# Patient Record
Sex: Female | Born: 1955 | Race: Black or African American | Hispanic: No | Smoking: Never smoker
Health system: Southern US, Community
[De-identification: ages and names within clinical notes are randomized; demographics above are authoritative.]

## PROBLEM LIST (undated history)

## (undated) DIAGNOSIS — E119 Type 2 diabetes mellitus without complications: Secondary | ICD-10-CM

## (undated) DIAGNOSIS — K219 Gastro-esophageal reflux disease without esophagitis: Secondary | ICD-10-CM

---

## 2019-08-28 DIAGNOSIS — E782 Mixed hyperlipidemia: Secondary | ICD-10-CM | POA: Insufficient documentation

## 2019-08-28 DIAGNOSIS — E118 Type 2 diabetes mellitus with unspecified complications: Secondary | ICD-10-CM | POA: Insufficient documentation

## 2019-08-28 DIAGNOSIS — K219 Gastro-esophageal reflux disease without esophagitis: Secondary | ICD-10-CM | POA: Insufficient documentation

## 2019-08-28 DIAGNOSIS — I10 Essential (primary) hypertension: Secondary | ICD-10-CM | POA: Insufficient documentation

## 2019-08-28 DIAGNOSIS — G894 Chronic pain syndrome: Secondary | ICD-10-CM | POA: Insufficient documentation

## 2019-09-09 DIAGNOSIS — G8929 Other chronic pain: Secondary | ICD-10-CM | POA: Insufficient documentation

## 2019-09-10 ENCOUNTER — Other Ambulatory Visit: Payer: Self-pay | Admitting: Nurse Practitioner

## 2019-09-10 DIAGNOSIS — R1013 Epigastric pain: Secondary | ICD-10-CM

## 2019-09-10 DIAGNOSIS — R1319 Other dysphagia: Secondary | ICD-10-CM

## 2019-09-10 DIAGNOSIS — K219 Gastro-esophageal reflux disease without esophagitis: Secondary | ICD-10-CM

## 2019-09-16 ENCOUNTER — Other Ambulatory Visit: Payer: Self-pay

## 2019-09-16 ENCOUNTER — Ambulatory Visit
Admission: RE | Admit: 2019-09-16 | Discharge: 2019-09-16 | Disposition: A | Discharge status: Home / Self Care | Payer: Medicare HMO | Source: Ambulatory Visit | Attending: Nurse Practitioner | Admitting: Nurse Practitioner

## 2019-09-16 DIAGNOSIS — K219 Gastro-esophageal reflux disease without esophagitis: Secondary | ICD-10-CM

## 2019-09-16 DIAGNOSIS — R1013 Epigastric pain: Secondary | ICD-10-CM | POA: Diagnosis present

## 2019-09-16 DIAGNOSIS — R1319 Other dysphagia: Secondary | ICD-10-CM | POA: Diagnosis present

## 2019-09-18 ENCOUNTER — Other Ambulatory Visit: Payer: Self-pay | Admitting: Otolaryngology

## 2019-09-18 DIAGNOSIS — R221 Localized swelling, mass and lump, neck: Secondary | ICD-10-CM

## 2019-09-30 ENCOUNTER — Other Ambulatory Visit: Payer: Self-pay

## 2019-09-30 ENCOUNTER — Ambulatory Visit
Admission: RE | Admit: 2019-09-30 | Discharge: 2019-09-30 | Disposition: A | Discharge status: Home / Self Care | Payer: Medicare HMO | Source: Ambulatory Visit | Attending: Otolaryngology | Admitting: Otolaryngology

## 2019-09-30 DIAGNOSIS — R221 Localized swelling, mass and lump, neck: Secondary | ICD-10-CM | POA: Diagnosis not present

## 2019-09-30 IMAGING — US US SOFT TISSUE HEAD/NECK
1 series · 14 of 19 positions shown · non-contrast
Comparison: None.

CLINICAL DATA: 63-year-old female with a history of neck mass

EXAM:
ULTRASOUND OF HEAD/NECK SOFT TISSUES
TECHNIQUE: Ultrasound examination of the head and neck soft tissues was
performed in the area of clinical concern.

[Series 1: us soft tissue head/neck · 0.06mm/px · 19 acquisitions, 14 frames shown]
[im 1/19]
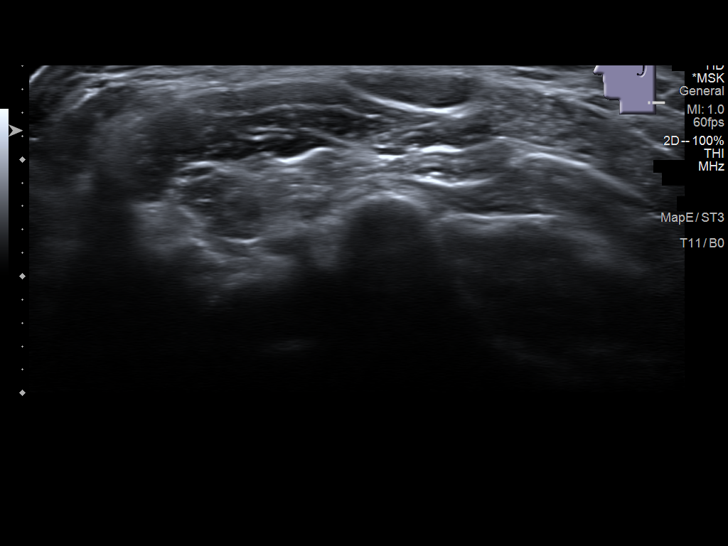
[im 3/19]
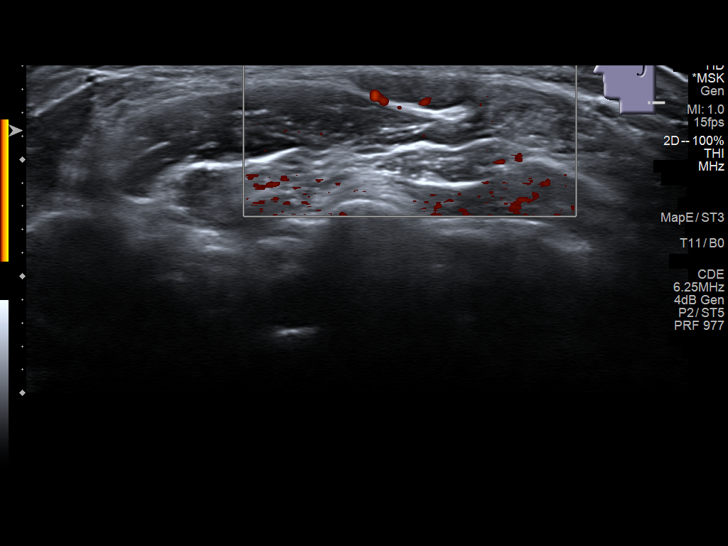
[im 4/19]
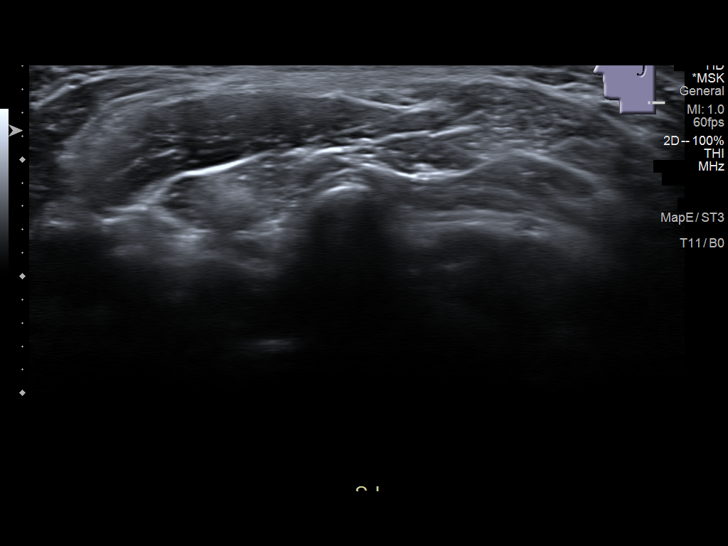
[im 5/19]
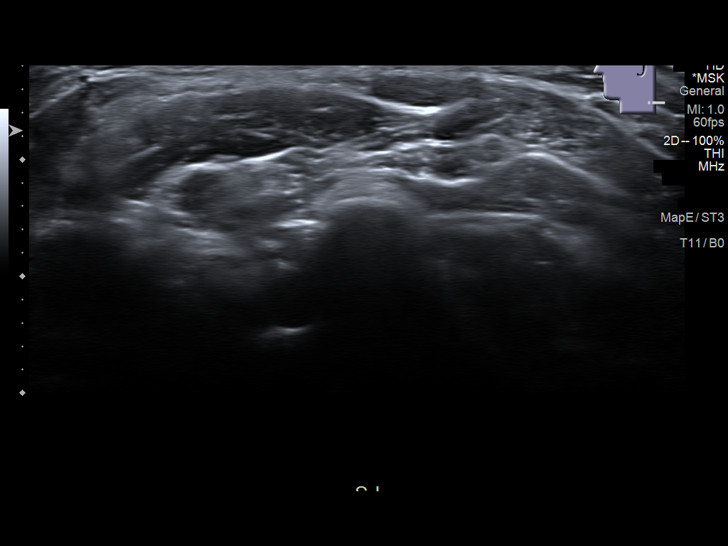
[im 7/19]
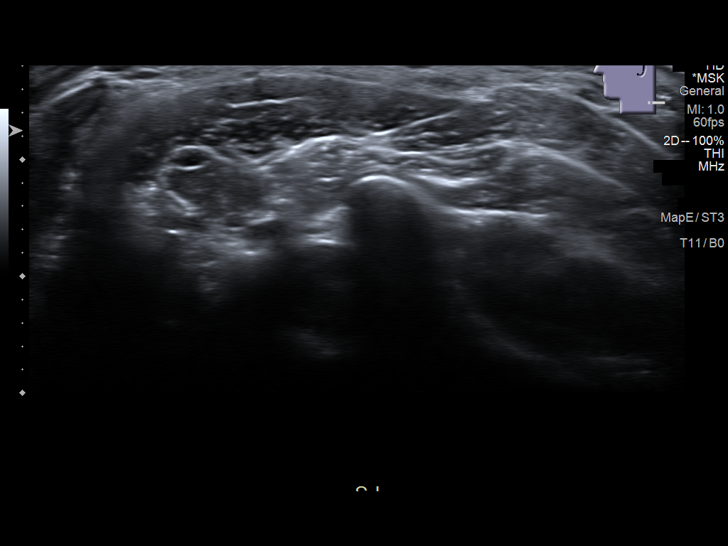
[im 8/19]
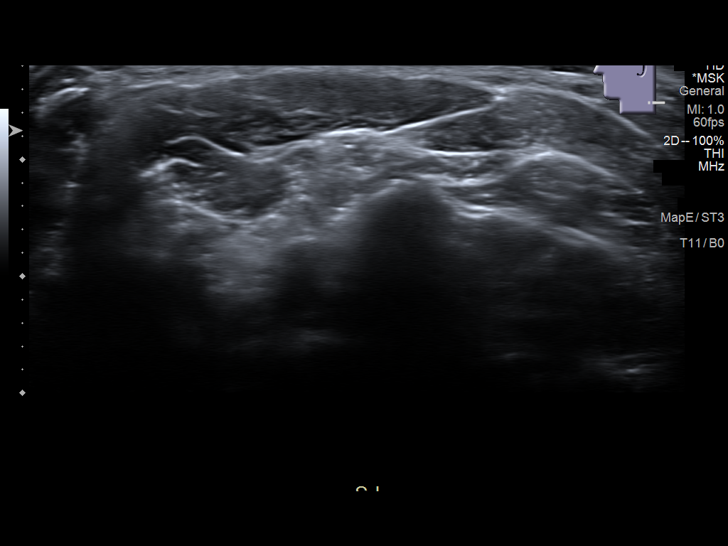
[im 9/19]
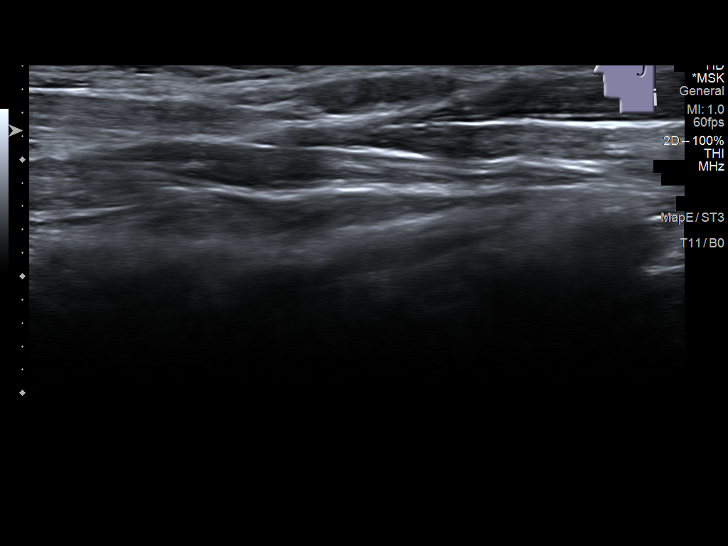
[im 11/19]
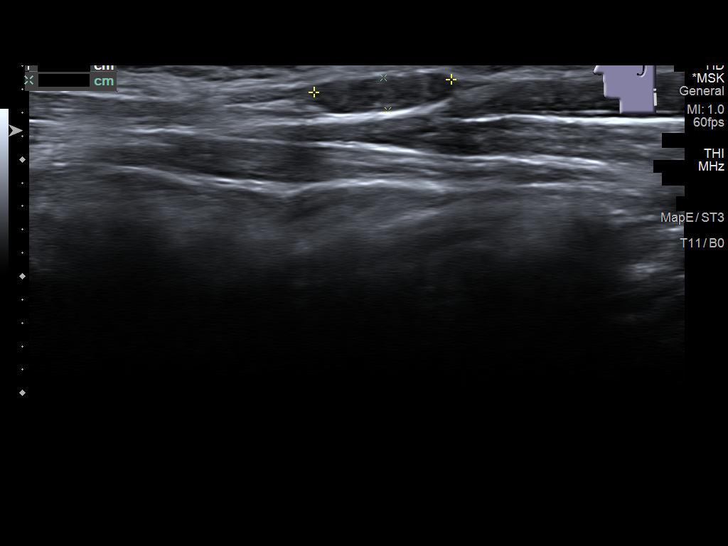
[im 12/19]
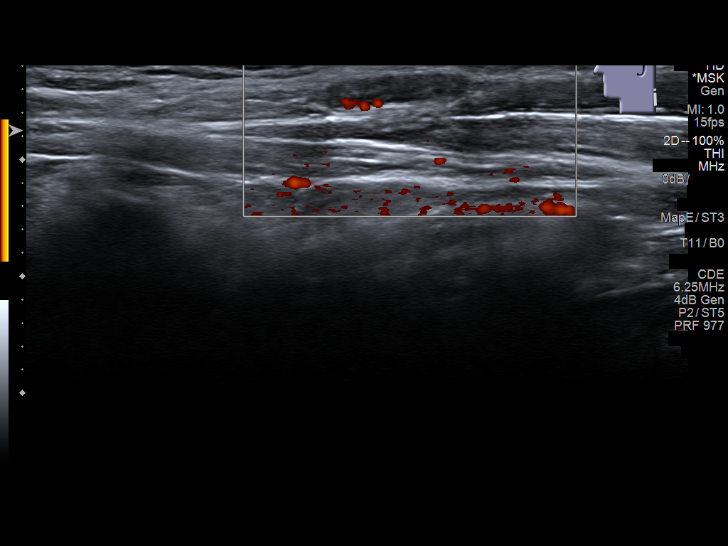
[im 13/19]
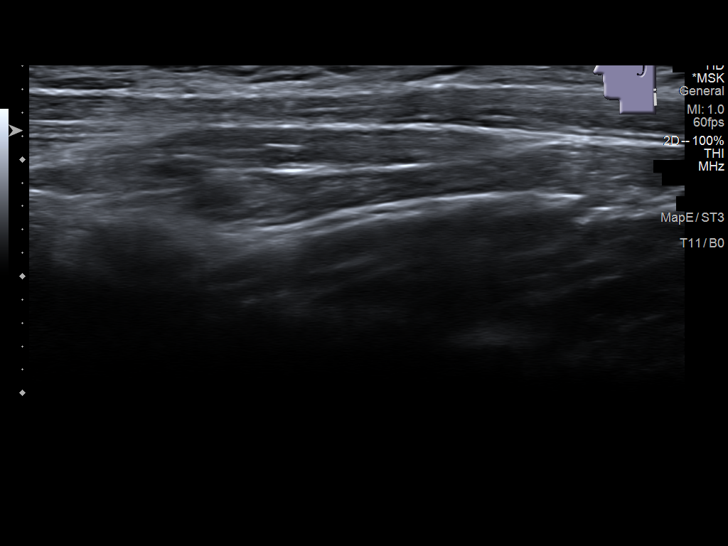
[im 15/19]
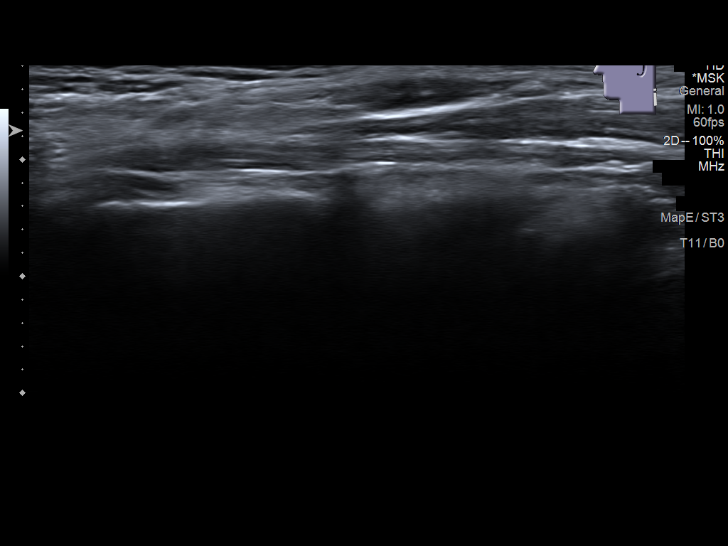
[im 16/19]
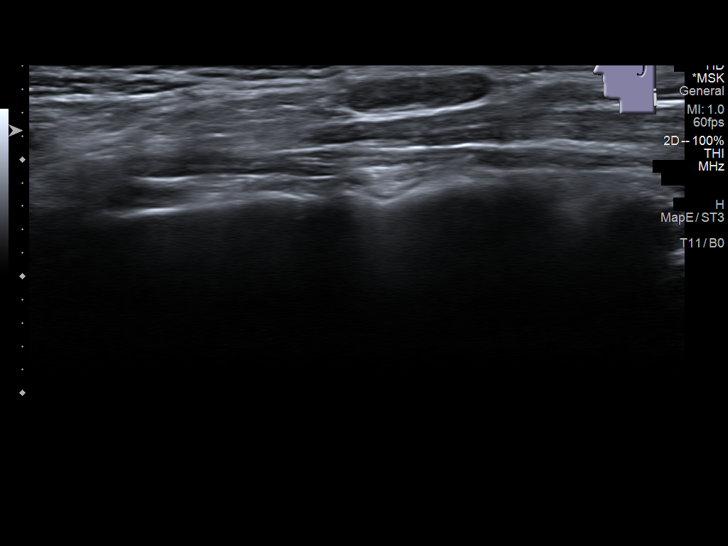
[im 17/19]
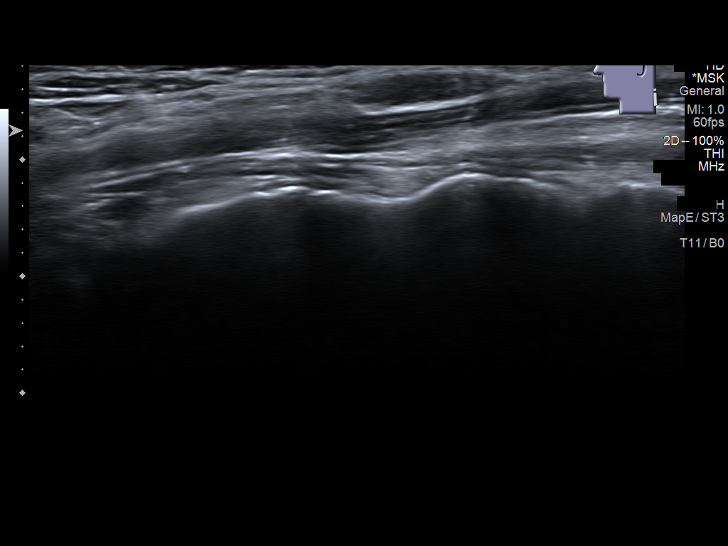
[im 19/19]
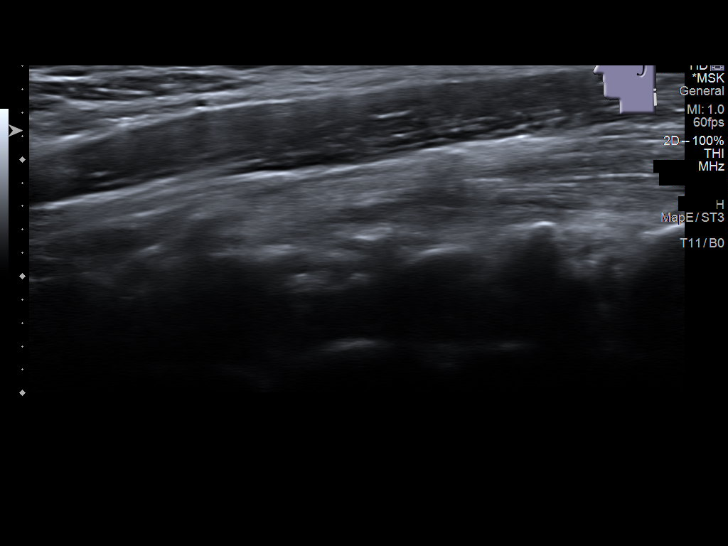

[14 of 19 positions shown; findings below may reference images not displayed]

FINDINGS: Grayscale and color duplex performed in the region of clinical
concern.

There is a uniformly hypoechoic soft tissue lesion isoechoic to the
adjacent musculature within the superficial soft tissues measuring
as long as 1.2 cm with a short axis dimension of 3 mm. Smooth border
with through transmission.
IMPRESSION: Directed duplex demonstrates nonspecific ovoid soft tissue lesion
which most likely represents a lymph node or lipoma

## 2019-12-11 ENCOUNTER — Other Ambulatory Visit: Payer: Self-pay | Admitting: Internal Medicine

## 2019-12-11 DIAGNOSIS — Z1231 Encounter for screening mammogram for malignant neoplasm of breast: Secondary | ICD-10-CM

## 2020-03-26 ENCOUNTER — Ambulatory Visit: Payer: Medicare HMO | Admitting: Urology

## 2020-04-07 ENCOUNTER — Ambulatory Visit: Payer: Medicare HMO | Admitting: Urology

## 2020-09-29 ENCOUNTER — Other Ambulatory Visit: Payer: Self-pay

## 2020-09-29 ENCOUNTER — Ambulatory Visit
Admission: RE | Admit: 2020-09-29 | Discharge: 2020-09-29 | Disposition: A | Discharge status: Home / Self Care | Payer: Medicare HMO | Source: Ambulatory Visit | Attending: Internal Medicine | Admitting: Internal Medicine

## 2020-09-29 DIAGNOSIS — Z1231 Encounter for screening mammogram for malignant neoplasm of breast: Secondary | ICD-10-CM | POA: Insufficient documentation

## 2020-10-17 ENCOUNTER — Emergency Department
Admission: EM | Admit: 2020-10-17 | Discharge: 2020-10-18 | Disposition: A | Discharge status: Home / Self Care | Payer: Medicare HMO | Attending: Emergency Medicine | Admitting: Emergency Medicine

## 2020-10-17 ENCOUNTER — Other Ambulatory Visit: Payer: Self-pay

## 2020-10-17 ENCOUNTER — Emergency Department: Payer: Medicare HMO

## 2020-10-17 ENCOUNTER — Encounter: Payer: Self-pay | Admitting: Emergency Medicine

## 2020-10-17 DIAGNOSIS — E782 Mixed hyperlipidemia: Secondary | ICD-10-CM | POA: Insufficient documentation

## 2020-10-17 DIAGNOSIS — I10 Essential (primary) hypertension: Secondary | ICD-10-CM | POA: Diagnosis not present

## 2020-10-17 DIAGNOSIS — K219 Gastro-esophageal reflux disease without esophagitis: Secondary | ICD-10-CM | POA: Insufficient documentation

## 2020-10-17 DIAGNOSIS — K5732 Diverticulitis of large intestine without perforation or abscess without bleeding: Secondary | ICD-10-CM | POA: Diagnosis not present

## 2020-10-17 DIAGNOSIS — R1012 Left upper quadrant pain: Secondary | ICD-10-CM | POA: Diagnosis present

## 2020-10-17 DIAGNOSIS — E1169 Type 2 diabetes mellitus with other specified complication: Secondary | ICD-10-CM | POA: Diagnosis not present

## 2020-10-17 DIAGNOSIS — K5792 Diverticulitis of intestine, part unspecified, without perforation or abscess without bleeding: Secondary | ICD-10-CM

## 2020-10-17 HISTORY — DX: Type 2 diabetes mellitus without complications: E11.9

## 2020-10-17 HISTORY — DX: Gastro-esophageal reflux disease without esophagitis: K21.9

## 2020-10-17 LAB — URINALYSIS, COMPLETE (UACMP) WITH MICROSCOPIC
Bacteria, UA: NONE SEEN
Bilirubin Urine: NEGATIVE
Glucose, UA: NEGATIVE mg/dL
Hgb urine dipstick: NEGATIVE
Ketones, ur: 5 mg/dL — AB
Leukocytes,Ua: NEGATIVE
Nitrite: NEGATIVE
Protein, ur: NEGATIVE mg/dL
Specific Gravity, Urine: 1.038 — ABNORMAL HIGH (ref 1.005–1.030)
Squamous Epithelial / HPF: NONE SEEN (ref 0–5)
pH: 6 (ref 5.0–8.0)

## 2020-10-17 LAB — TROPONIN I (HIGH SENSITIVITY)
Troponin I (High Sensitivity): 4 ng/L (ref <18)
Troponin I (High Sensitivity): 4 ng/L (ref <18)

## 2020-10-17 LAB — COMPREHENSIVE METABOLIC PANEL
ALT: 13 U/L (ref 0–44)
AST: 20 U/L (ref 15–41)
Albumin: 4.4 g/dL (ref 3.5–5.0)
Alkaline Phosphatase: 77 U/L (ref 38–126)
Anion gap: 12 (ref 5–15)
BUN: 17 mg/dL (ref 8–23)
CO2: 26 mmol/L (ref 22–32)
Calcium: 9.3 mg/dL (ref 8.9–10.3)
Chloride: 100 mmol/L (ref 98–111)
Creatinine, Ser: 0.95 mg/dL (ref 0.44–1.00)
GFR, Estimated: >60 mL/min (ref >60)
Glucose, Bld: 206 mg/dL — ABNORMAL HIGH (ref 70–99)
Potassium: 3.7 mmol/L (ref 3.5–5.1)
Sodium: 138 mmol/L (ref 135–145)
Total Bilirubin: 0.7 mg/dL (ref 0.3–1.2)
Total Protein: 8 g/dL (ref 6.5–8.1)

## 2020-10-17 LAB — CBC
HCT: 39.3 % (ref 36.0–46.0)
Hemoglobin: 13.2 g/dL (ref 12.0–15.0)
MCH: 28.3 pg (ref 26.0–34.0)
MCHC: 33.6 g/dL (ref 30.0–36.0)
MCV: 84.3 fL (ref 80.0–100.0)
Platelets: 256 10*3/uL (ref 150–400)
RBC: 4.66 MIL/uL (ref 3.87–5.11)
RDW: 13.3 % (ref 11.5–15.5)
WBC: 11.6 10*3/uL — ABNORMAL HIGH (ref 4.0–10.5)
nRBC: 0 % (ref 0.0–0.2)

## 2020-10-17 LAB — LIPASE, BLOOD: Lipase: 29 U/L (ref 11–51)

## 2020-10-17 MED ORDER — ONDANSETRON 4 MG PO TBDP: 4.0000 mg | ORAL_TABLET | Freq: Once | ORAL | Status: DC | PRN

## 2020-10-17 MED ORDER — ALUM & MAG HYDROXIDE-SIMETH 200-200-20 MG/5ML PO SUSP
30.0000 mL | Freq: Once | ORAL | Status: AC
  Administered 2020-10-17: 30 mL via ORAL
  Filled 2020-10-17: qty 30

## 2020-10-17 MED ORDER — IOHEXOL 300 MG/ML  SOLN
100.0000 mL | Freq: Once | INTRAMUSCULAR | Status: AC | PRN
  Administered 2020-10-17: 100 mL via INTRAVENOUS

## 2020-10-17 MED ORDER — DICYCLOMINE HCL 10 MG/5ML PO SOLN
10.0000 mg | Freq: Once | ORAL | Status: AC
  Administered 2020-10-17: 10 mg via ORAL
  Filled 2020-10-17: qty 5

## 2020-10-17 MED ORDER — LIDOCAINE VISCOUS HCL 2 % MT SOLN
15.0000 mL | Freq: Once | OROMUCOSAL | Status: AC
  Administered 2020-10-17: 15 mL via ORAL
  Filled 2020-10-17: qty 15

## 2020-10-17 NOTE — ED Notes (Signed)
Pt called out with call bell stating she was ready to give urine sample. Pt disconnected from monitor and ambulatory independently to toilet. Clean catch urine sample collected and sent to lab.

## 2020-10-17 NOTE — ED Notes (Signed)
Patient transported to CT 

## 2020-10-17 NOTE — ED Triage Notes (Signed)
Pt arrived via POV with reports of upper abd pain since 3pm today with vomiting. Pt also reports back pain with the abd pain.  Denies any diarrhea.

## 2020-10-17 NOTE — ED Provider Notes (Addendum)
[facility] Emergency Department Provider Note   ____________________________________________   First MD Initiated Contact with Patient 10/17/20 2104     (approximate)  I have reviewed the triage vital signs and the nursing notes.   HISTORY  Chief Complaint Abdominal Pain   HPI Leslie Nixon is a 64 y.o. female who reports left upper abdominal pain starting this afternoon.  She has had some vomiting with it.  Also a little bit of back pain that she complained back to the nurse.  No diarrhea or constipation.  No blood in the vomit.  She is not had this before.  She reports the pain is sharp.  Moderate in severity.  Nothing seems to make it better or worse although it is worse with palpation.         Past Medical History:  Diagnosis Date  . Diabetes mellitus without complication (HCC)   . GERD (gastroesophageal reflux disease)     Patient Active Problem List   Diagnosis Date Noted  . Chronic epigastric pain 09/09/2019  . Chronic pain syndrome 08/28/2019  . HTN, goal below 140/80 08/28/2019  . Mixed hyperlipidemia 08/28/2019  . Gastroesophageal reflux disease without esophagitis 08/28/2019  . Type II diabetes mellitus with complication (HCC) 08/28/2019    History reviewed. No pertinent surgical history.  Prior to Admission medications   Not on File    Allergies Lisinopril  Family History  Problem Relation Age of Onset  . Breast cancer Neg Hx     Social History Social History   Tobacco Use  . Smoking status: Never Smoker  . Smokeless tobacco: Never Used  Vaping Use  . Vaping Use: Never used  Substance Use Topics  . Alcohol use: Not Currently  . Drug use: Not on file    Review of Systems  Constitutional: No fever/chills Eyes: No visual changes. ENT: No sore throat. Cardiovascular: Denies chest pain. Respiratory: Denies shortness of breath. Gastrointestinal: abdominal pain.  Nausea,  vomiting.  No diarrhea.  No  constipation. Genitourinary: Negative for dysuria. Musculoskeletal: Negative for back pain. Skin: Negative for rash. Neurological: Negative for headaches, focal weakness  ____________________________________________   PHYSICAL EXAM:  VITAL SIGNS: ED Triage Vitals  Enc Vitals Group     BP 10/17/20 1953 (!) 152/78     Pulse Rate 10/17/20 1953 79     Resp 10/17/20 1953 18     Temp 10/17/20 1953 99.4 F (37.4 C)     Temp Source 10/17/20 1953 Oral     SpO2 10/17/20 1953 98 %     Weight 10/17/20 1949 140 lb (63.5 kg)     Height 10/17/20 1949 5\' 1"  (1.549 m)     Head Circumference --      Peak Flow --      Pain Score 10/17/20 1948 8     Pain Loc --      Pain Edu? --      Excl. in GC? --     Constitutional: Alert and oriented. Well appearing and in no acute distress. Eyes: Conjunctivae are normal. PER EOMI. Head: Atraumatic. Nose: No congestion/rhinnorhea. Mouth/Throat: Mucous membranes are moist.  Oropharynx non-erythematous. Neck: No stridor.  Cardiovascular: Normal rate, regular rhythm. Grossly normal heart sounds.  Good peripheral circulation. Respiratory: Normal respiratory effort.  No retractions. Lungs CTAB. Gastrointestinal: Soft to palpation left upper quadrant no distention. No abdominal bruits.  Musculoskeletal: No lower extremity tenderness nor edema.  Neurologic:  Normal speech and language. No gross focal neurologic deficits  are appreciated.  Skin:  Skin is warm, dry and intact. No rash noted.   ____________________________________________   LABS (all labs ordered are listed, but only abnormal results are displayed)  Labs Reviewed  COMPREHENSIVE METABOLIC PANEL - Abnormal; Notable for the following components:      Result Value   Glucose, Bld 206 (*)    All other components within normal limits  CBC - Abnormal; Notable for the following components:   WBC 11.6 (*)    All other components within normal limits  URINALYSIS, COMPLETE (UACMP) WITH MICROSCOPIC  - Abnormal; Notable for the following components:   Color, Urine YELLOW (*)    APPearance CLEAR (*)    Specific Gravity, Urine 1.038 (*)    Ketones, ur 5 (*)    All other components within normal limits  LIPASE, BLOOD  TROPONIN I (HIGH SENSITIVITY)  TROPONIN I (HIGH SENSITIVITY)   ____________________________________________  EKG EKG read interpreted by me shows normal sinus rhythm rate of 61 left axis decreased R wave progression but no acute ST-T wave changes computer is also reading left anterior hemiblock.  ____________________________________________  RADIOLOGY Jill Poling, personally viewed and evaluated these images (plain radiographs) as part of my medical decision making, as well as reviewing the written report by the radiologist.  ED MD interpretation: CT read by radiology reviewed by me 6 is suggestive of a possible sigmoid diverticulitis with possible narrowing of the sigmoid and one area and some stomach wall thickening which possibly could be due to possible underdistention  Official radiology report(s): CT ABDOMEN PELVIS W CONTRAST  Result Date: 10/17/2020 CLINICAL DATA:  Upper abdominal pain EXAM: CT ABDOMEN AND PELVIS WITH CONTRAST TECHNIQUE: Multidetector CT imaging of the abdomen and pelvis was performed using the standard protocol following bolus administration of intravenous contrast. CONTRAST:  OMNIPAQUE IOHEXOL 300 MG/ML  SOLN COMPARISON:  None. FINDINGS: Lower chest: The lung bases are clear. The heart size is normal. Hepatobiliary: The liver is normal. Status post cholecystectomy.There is no biliary ductal dilation. Pancreas: Normal contours without ductal dilatation. No peripancreatic fluid collection. Spleen: Unremarkable. Adrenals/Urinary Tract: --Adrenal glands: Unremarkable. --Right kidney/ureter: No hydronephrosis or radiopaque kidney stones. --Left kidney/ureter: No hydronephrosis or radiopaque kidney stones. --Urinary bladder: Unremarkable.  Stomach/Bowel: --Stomach/Duodenum: There is suggestion of mild wall thickening at the level of the gastric antrum. The proximal duodenum is unremarkable. --Small bowel: Unremarkable. --Colon: There is sigmoid diverticulosis. There is questionable mild fat stranding about the sigmoid colon (axial series 2, image 58). At this level, there is some luminal narrowing of the sigmoid colon. --Appendix: Normal. Vascular/Lymphatic: Normal course and caliber of the major abdominal vessels. --No retroperitoneal lymphadenopathy. --No mesenteric lymphadenopathy. --No pelvic or inguinal lymphadenopathy. Reproductive: Status post hysterectomy. No adnexal mass. Other: No ascites or free air. The abdominal wall is normal. Musculoskeletal. There is a bilateral pars defect at L5 resulting in grade 1 anterolisthesis of L5 on S1. IMPRESSION: 1. Sigmoid diverticulosis with questionable mild fat stranding about the sigmoid colon. At this level, there is some luminal narrowing of the sigmoid colon. Findings may be secondary to low-grade diverticulitis. Correlation with patient symptomatology is recommended. Given the underlying luminal narrowing, outpatient colonoscopy is recommended for further evaluation. 2. Suggestion of mild wall thickening at the level of the gastric antrum. This could be secondary to underdistention, but gastritis is not excluded. 3. 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 Electronically Signed   By: Beryle Quant.D.  On: 10/17/2020 22:44    ____________________________________________   PROCEDURES  Procedure(s) performed (including Critical Care):  Procedures   ____________________________________________   INITIAL IMPRESSION / ASSESSMENT AND PLAN / ED COURSE  Patient with left upper quadrant pain which is not severe. Anticipate we will treat her for her possible sigmoid diverticulitis and have her follow-up.              ____________________________________________   FINAL CLINICAL IMPRESSION(S) / ED DIAGNOSES  Final diagnoses:  Left upper quadrant abdominal pain     ED Discharge Orders    None      *Please note:  Paris Hohn was evaluated in Emergency Department on 10/17/2020 for the symptoms described in the history of present illness. She was evaluated in the context of the global COVID-19 pandemic, which necessitated consideration that the patient might be at risk for infection with the SARS-CoV-2 virus that causes COVID-19. Institutional protocols and algorithms that pertain to the evaluation of patients at risk for COVID-19 are in a state of rapid change based on information released by regulatory bodies including the CDC and federal and state organizations. These policies and algorithms were followed during the patient's care in the ED.  Some ED evaluations and interventions may be delayed as a result of limited staffing during and the pandemic.*   Note:  This document was prepared using Dragon voice recognition software and may include unintentional dictation errors.    Arnaldo Natal, MD 10/17/20 2340 ----------------------------------------- 12:16 AM on 10/18/2020 -----------------------------------------  Discussed patient with Dr. Tobi Bastos he will follow up.  We will treat her with Cipro and Flagyl.   Arnaldo Natal, MD 10/18/20 [phone]

## 2020-10-18 MED ORDER — ONDANSETRON HCL 4 MG/2ML IJ SOLN
4.0000 mg | Freq: Once | INTRAMUSCULAR | Status: AC
  Administered 2020-10-18: 4 mg via INTRAVENOUS
  Filled 2020-10-18: qty 2

## 2020-10-18 MED ORDER — METRONIDAZOLE 500 MG PO TABS: 500.0000 mg | ORAL_TABLET | Freq: Three times a day (TID) | ORAL | 0 refills | Status: AC

## 2020-10-18 MED ORDER — CIPROFLOXACIN HCL 500 MG PO TABS
500.0000 mg | ORAL_TABLET | Freq: Once | ORAL | Status: AC
  Administered 2020-10-18: 500 mg via ORAL
  Filled 2020-10-18: qty 1

## 2020-10-18 MED ORDER — METRONIDAZOLE 500 MG PO TABS
500.0000 mg | ORAL_TABLET | Freq: Once | ORAL | Status: AC
  Administered 2020-10-18: 500 mg via ORAL
  Filled 2020-10-18: qty 1

## 2020-10-18 MED ORDER — CIPROFLOXACIN HCL 500 MG PO TABS: 500.0000 mg | ORAL_TABLET | Freq: Two times a day (BID) | ORAL | 0 refills | Status: AC

## 2020-10-18 MED ORDER — ONDANSETRON 4 MG PO TBDP: 4.0000 mg | ORAL_TABLET | Freq: Three times a day (TID) | ORAL | 0 refills | Status: AC | PRN

## 2020-10-18 NOTE — Discharge Instructions (Addendum)
The CT looks like you may have some diverticulitis and a little bit of thickening of the wall of the stomach.  I am going to give you some antibiotics for the diverticulitis.  Please return if you get worse pain fever or vomiting.  Please follow-up with Dr. Judithann Sheen and also with Dr. Tobi Bastos the gastroenterologist.  Use Tylenol for the pain.  If you get nauseated but are not vomiting more than once you can use some Zofran melt on your tongue wafers.  If you begin vomiting 2 or 3 or 4 times please return.

## 2020-12-16 ENCOUNTER — Ambulatory Visit: Payer: Medicare HMO | Admitting: Gastroenterology

## 2021-01-27 ENCOUNTER — Other Ambulatory Visit: Payer: Self-pay

## 2021-01-27 ENCOUNTER — Encounter: Payer: Self-pay | Admitting: Gastroenterology

## 2021-01-27 ENCOUNTER — Encounter (INDEPENDENT_AMBULATORY_CARE_PROVIDER_SITE_OTHER): Payer: Medicare HMO | Admitting: Gastroenterology

## 2021-01-28 NOTE — Progress Notes (Signed)
Visit in error
